# Patient Record
Sex: Male | Born: 1935 | Race: White | Hispanic: No | Marital: Married
Health system: Southern US, Community
[De-identification: ages and names within clinical notes are randomized; demographics above are authoritative.]

---

## 2005-01-26 ENCOUNTER — Ambulatory Visit: Payer: Self-pay | Admitting: Internal Medicine

## 2005-02-09 ENCOUNTER — Ambulatory Visit: Payer: Self-pay

## 2005-03-01 ENCOUNTER — Ambulatory Visit: Payer: Self-pay | Admitting: Internal Medicine

## 2006-03-20 ENCOUNTER — Other Ambulatory Visit: Payer: Self-pay

## 2006-03-20 ENCOUNTER — Inpatient Hospital Stay: Payer: Self-pay | Admitting: Internal Medicine

## 2006-03-21 ENCOUNTER — Other Ambulatory Visit: Payer: Self-pay

## 2006-11-15 ENCOUNTER — Ambulatory Visit: Payer: Self-pay | Admitting: Internal Medicine

## 2007-02-05 ENCOUNTER — Ambulatory Visit: Payer: Self-pay | Admitting: Internal Medicine

## 2007-05-01 ENCOUNTER — Ambulatory Visit: Payer: Self-pay | Admitting: Internal Medicine

## 2007-05-12 ENCOUNTER — Ambulatory Visit: Payer: Self-pay | Admitting: Internal Medicine

## 2007-07-28 ENCOUNTER — Other Ambulatory Visit: Payer: Self-pay

## 2007-07-28 ENCOUNTER — Inpatient Hospital Stay: Payer: Self-pay | Admitting: Cardiovascular Disease

## 2007-07-29 ENCOUNTER — Other Ambulatory Visit: Payer: Self-pay

## 2007-07-30 ENCOUNTER — Other Ambulatory Visit: Payer: Self-pay

## 2008-02-05 ENCOUNTER — Ambulatory Visit: Payer: Self-pay | Admitting: Internal Medicine

## 2010-09-12 ENCOUNTER — Ambulatory Visit: Payer: Self-pay

## 2011-04-03 ENCOUNTER — Inpatient Hospital Stay: Payer: Self-pay | Admitting: Internal Medicine

## 2011-04-03 LAB — COMPREHENSIVE METABOLIC PANEL
Albumin: 3.1 g/dL — ABNORMAL LOW (ref 3.4–5.0)
Alkaline Phosphatase: 41 U/L — ABNORMAL LOW (ref 50–136)
Anion Gap: 11 (ref 7–16)
Calcium, Total: 8.4 mg/dL — ABNORMAL LOW (ref 8.5–10.1)
Co2: 29 mmol/L (ref 21–32)
EGFR (African American): >60
EGFR (Non-African Amer.): >60
Glucose: 95 mg/dL (ref 65–99)
Osmolality: 279 (ref 275–301)
Potassium: 3.3 mmol/L — ABNORMAL LOW (ref 3.5–5.1)
SGOT(AST): 22 U/L (ref 15–37)
Sodium: 140 mmol/L (ref 136–145)

## 2011-04-03 LAB — CBC
HGB: 14.4 g/dL (ref 13.0–18.0)
MCH: 30.5 pg (ref 26.0–34.0)
MCHC: 32.4 g/dL (ref 32.0–36.0)
Platelet: 152 10*3/uL (ref 150–440)
RBC: 4.74 10*6/uL (ref 4.40–5.90)
WBC: 8.9 10*3/uL (ref 3.8–10.6)

## 2011-04-04 LAB — CBC WITH DIFFERENTIAL/PLATELET
Basophil #: 0 10*3/uL (ref 0.0–0.1)
Eosinophil #: 0 10*3/uL (ref 0.0–0.7)
Eosinophil %: 0 %
HCT: 45.2 % (ref 40.0–52.0)
Lymphocyte #: 0.7 10*3/uL — ABNORMAL LOW (ref 1.0–3.6)
MCH: 30.7 pg (ref 26.0–34.0)
MCV: 93 fL (ref 80–100)
Monocyte #: 0.1 10*3/uL (ref 0.0–0.7)
Monocyte %: 1.7 %
Neutrophil #: 7.6 10*3/uL — ABNORMAL HIGH (ref 1.4–6.5)
Platelet: 165 10*3/uL (ref 150–440)
RBC: 4.86 10*6/uL (ref 4.40–5.90)
WBC: 8.5 10*3/uL (ref 3.8–10.6)

## 2011-04-04 LAB — BASIC METABOLIC PANEL
Anion Gap: 12 (ref 7–16)
BUN: 18 mg/dL (ref 7–18)
Chloride: 103 mmol/L (ref 98–107)
Co2: 23 mmol/L (ref 21–32)
EGFR (Non-African Amer.): >60
Osmolality: 282 (ref 275–301)
Potassium: 4.1 mmol/L (ref 3.5–5.1)

## 2011-04-04 LAB — TROPONIN I: Troponin-I: <0.02 ng/mL

## 2011-04-04 LAB — CK TOTAL AND CKMB (NOT AT ARMC): CK-MB: 9.7 ng/mL — ABNORMAL HIGH (ref 0.5–3.6)

## 2011-04-07 LAB — URINALYSIS, COMPLETE
Bacteria: NONE SEEN
Bilirubin,UR: NEGATIVE
Blood: NEGATIVE
Glucose,UR: NEGATIVE mg/dL (ref 0–75)
Ketone: NEGATIVE
Nitrite: NEGATIVE
Protein: NEGATIVE
RBC,UR: NONE SEEN /HPF (ref 0–5)
Specific Gravity: 1.006 (ref 1.003–1.030)
WBC UR: NONE SEEN /HPF (ref 0–5)

## 2011-04-08 LAB — BASIC METABOLIC PANEL
Anion Gap: 5 — ABNORMAL LOW (ref 7–16)
BUN: 24 mg/dL — ABNORMAL HIGH (ref 7–18)
Chloride: 103 mmol/L (ref 98–107)
Co2: 31 mmol/L (ref 21–32)
EGFR (African American): >60
Glucose: 101 mg/dL — ABNORMAL HIGH (ref 65–99)
Osmolality: 282 (ref 275–301)

## 2011-04-08 LAB — CBC WITH DIFFERENTIAL/PLATELET
Basophil %: 0.1 %
Eosinophil %: 0 %
HCT: 47.2 % (ref 40.0–52.0)
HGB: 15.3 g/dL (ref 13.0–18.0)
Lymphocyte %: 7.6 %
MCHC: 32.3 g/dL (ref 32.0–36.0)
MCV: 95 fL (ref 80–100)
Monocyte %: 5 %
Neutrophil %: 87.3 %
Platelet: 185 10*3/uL (ref 150–440)
WBC: 13.1 10*3/uL — ABNORMAL HIGH (ref 3.8–10.6)

## 2011-04-09 LAB — CULTURE, BLOOD (SINGLE)

## 2011-04-23 ENCOUNTER — Ambulatory Visit: Payer: Self-pay

## 2011-04-23 LAB — CBC WITH DIFFERENTIAL/PLATELET
Basophil #: 0 10*3/uL (ref 0.0–0.1)
Basophil %: 0.4 %
HCT: 42.5 % (ref 40.0–52.0)
HGB: 14.2 g/dL (ref 13.0–18.0)
Lymphocyte #: 1.3 10*3/uL (ref 1.0–3.6)
MCH: 31.1 pg (ref 26.0–34.0)
MCHC: 33.4 g/dL (ref 32.0–36.0)
MCV: 93 fL (ref 80–100)
Monocyte #: 0.5 10*3/uL (ref 0.0–0.7)
Monocyte %: 6.8 %
Neutrophil #: 5.4 10*3/uL (ref 1.4–6.5)
Platelet: 135 10*3/uL — ABNORMAL LOW (ref 150–440)
RDW: 14.3 % (ref 11.5–14.5)
WBC: 7.2 10*3/uL (ref 3.8–10.6)

## 2011-04-23 LAB — COMPREHENSIVE METABOLIC PANEL
Albumin: 3 g/dL — ABNORMAL LOW (ref 3.4–5.0)
Alkaline Phosphatase: 58 U/L (ref 50–136)
BUN: 20 mg/dL — ABNORMAL HIGH (ref 7–18)
Chloride: 105 mmol/L (ref 98–107)
Co2: 27 mmol/L (ref 21–32)
EGFR (Non-African Amer.): >60
Potassium: 4.3 mmol/L (ref 3.5–5.1)
SGOT(AST): 23 U/L (ref 15–37)
Sodium: 141 mmol/L (ref 136–145)

## 2011-04-23 LAB — PRO B NATRIURETIC PEPTIDE: B-Type Natriuretic Peptide: 433 pg/mL (ref 0–450)

## 2011-06-25 ENCOUNTER — Ambulatory Visit: Payer: Self-pay

## 2011-08-03 ENCOUNTER — Inpatient Hospital Stay: Payer: Self-pay | Admitting: Internal Medicine

## 2011-08-03 LAB — COMPREHENSIVE METABOLIC PANEL
Bilirubin,Total: 0.3 mg/dL (ref 0.2–1.0)
Calcium, Total: 8.4 mg/dL — ABNORMAL LOW (ref 8.5–10.1)
Chloride: 102 mmol/L (ref 98–107)
Co2: 31 mmol/L (ref 21–32)
Creatinine: 1.12 mg/dL (ref 0.60–1.30)
EGFR (African American): >60
EGFR (Non-African Amer.): >60
Osmolality: 280 (ref 275–301)
Sodium: 140 mmol/L (ref 136–145)
Total Protein: 7.7 g/dL (ref 6.4–8.2)

## 2011-08-03 LAB — CBC
HGB: 14.8 g/dL (ref 13.0–18.0)
MCH: 31 pg (ref 26.0–34.0)
MCHC: 33.5 g/dL (ref 32.0–36.0)
RDW: 13.9 % (ref 11.5–14.5)

## 2011-08-03 LAB — PRO B NATRIURETIC PEPTIDE: B-Type Natriuretic Peptide: 169 pg/mL (ref 0–450)

## 2011-08-03 LAB — TROPONIN I: Troponin-I: 0.04 ng/mL

## 2011-08-03 LAB — MAGNESIUM: Magnesium: 2.1 mg/dL

## 2011-08-03 LAB — CK TOTAL AND CKMB (NOT AT ARMC)
CK, Total: 261 U/L — ABNORMAL HIGH (ref 35–232)
CK-MB: 6.4 ng/mL — ABNORMAL HIGH (ref 0.5–3.6)

## 2011-08-04 LAB — BASIC METABOLIC PANEL
Anion Gap: 13 (ref 7–16)
BUN: 17 mg/dL (ref 7–18)
Co2: 25 mmol/L (ref 21–32)
Creatinine: 1.46 mg/dL — ABNORMAL HIGH (ref 0.60–1.30)
EGFR (African American): 52 — ABNORMAL LOW
Sodium: 142 mmol/L (ref 136–145)

## 2011-08-05 LAB — CBC WITH DIFFERENTIAL/PLATELET
Basophil %: 0.1 %
Eosinophil #: 0 10*3/uL (ref 0.0–0.7)
HCT: 44.1 % (ref 40.0–52.0)
HGB: 14.1 g/dL (ref 13.0–18.0)
Lymphocyte %: 5.5 %
MCH: 29.9 pg (ref 26.0–34.0)
MCHC: 32 g/dL (ref 32.0–36.0)
Monocyte #: 0.4 x10 3/mm (ref 0.2–1.0)
Neutrophil %: 91.5 %
Platelet: 170 10*3/uL (ref 150–440)
RDW: 14.3 % (ref 11.5–14.5)
WBC: 12.9 10*3/uL — ABNORMAL HIGH (ref 3.8–10.6)

## 2011-08-05 LAB — BASIC METABOLIC PANEL
Anion Gap: 8 (ref 7–16)
BUN: 24 mg/dL — ABNORMAL HIGH (ref 7–18)
Chloride: 107 mmol/L (ref 98–107)
Glucose: 138 mg/dL — ABNORMAL HIGH (ref 65–99)
Osmolality: 293 (ref 275–301)
Potassium: 4.3 mmol/L (ref 3.5–5.1)
Sodium: 144 mmol/L (ref 136–145)

## 2011-08-08 LAB — CULTURE, BLOOD (SINGLE)

## 2011-08-23 ENCOUNTER — Ambulatory Visit: Payer: Self-pay | Admitting: Internal Medicine

## 2011-09-01 LAB — COMPREHENSIVE METABOLIC PANEL
Albumin: 2.8 g/dL — ABNORMAL LOW (ref 3.4–5.0)
Alkaline Phosphatase: 57 U/L (ref 50–136)
Anion Gap: 7 (ref 7–16)
BUN: 14 mg/dL (ref 7–18)
Chloride: 105 mmol/L (ref 98–107)
EGFR (African American): >60
Glucose: 83 mg/dL (ref 65–99)
Osmolality: 285 (ref 275–301)
SGOT(AST): 21 U/L (ref 15–37)
SGPT (ALT): 17 U/L (ref 12–78)
Total Protein: 7 g/dL (ref 6.4–8.2)

## 2011-09-01 LAB — CBC
HGB: 13.5 g/dL (ref 13.0–18.0)
MCHC: 32.6 g/dL (ref 32.0–36.0)
MCV: 93 fL (ref 80–100)
Platelet: 220 10*3/uL (ref 150–440)
RBC: 4.47 10*6/uL (ref 4.40–5.90)

## 2011-09-01 LAB — APTT: Activated PTT: 28.4 secs (ref 23.6–35.9)

## 2011-09-01 LAB — CK TOTAL AND CKMB (NOT AT ARMC)
CK, Total: 85 U/L (ref 35–232)
CK-MB: 2.2 ng/mL (ref 0.5–3.6)

## 2011-09-01 LAB — PROTIME-INR
INR: 0.9
Prothrombin Time: 12.7 secs (ref 11.5–14.7)

## 2011-09-02 ENCOUNTER — Inpatient Hospital Stay: Payer: Self-pay | Admitting: Internal Medicine

## 2011-09-02 LAB — CK TOTAL AND CKMB (NOT AT ARMC)
CK, Total: 139 U/L (ref 35–232)
CK, Total: 356 U/L — ABNORMAL HIGH (ref 35–232)
CK-MB: 3.5 ng/mL (ref 0.5–3.6)
CK-MB: 6.7 ng/mL — ABNORMAL HIGH (ref 0.5–3.6)

## 2011-09-02 LAB — APTT
Activated PTT: 28 secs (ref 23.6–35.9)
Activated PTT: 33 secs (ref 23.6–35.9)

## 2011-09-02 LAB — CBC WITH DIFFERENTIAL/PLATELET
Basophil #: 0 10*3/uL (ref 0.0–0.1)
Eosinophil %: 4.8 %
Lymphocyte #: 1.3 10*3/uL (ref 1.0–3.6)
Lymphocyte %: 30.6 %
MCH: 30.4 pg (ref 26.0–34.0)
MCV: 91 fL (ref 80–100)
Monocyte %: 14.6 %
Neutrophil %: 49.5 %
Platelet: 222 10*3/uL (ref 150–440)
RBC: 4.36 10*6/uL — ABNORMAL LOW (ref 4.40–5.90)
RDW: 13.6 % (ref 11.5–14.5)
WBC: 4.3 10*3/uL (ref 3.8–10.6)

## 2011-09-03 LAB — APTT: Activated PTT: 83.7 secs — ABNORMAL HIGH (ref 23.6–35.9)

## 2011-09-03 LAB — LIPID PANEL
Cholesterol: 239 mg/dL — ABNORMAL HIGH (ref 0–200)
Ldl Cholesterol, Calc: 178 mg/dL — ABNORMAL HIGH (ref 0–100)
VLDL Cholesterol, Calc: 23 mg/dL (ref 5–40)

## 2011-09-04 LAB — PLATELET COUNT: Platelet: 210 10*3/uL (ref 150–440)

## 2011-09-04 LAB — APTT: Activated PTT: 157.9 secs — ABNORMAL HIGH (ref 23.6–35.9)

## 2011-09-04 LAB — HEMOGLOBIN: HGB: 13 g/dL (ref 13.0–18.0)

## 2011-09-05 LAB — APTT
Activated PTT: 47.1 secs — ABNORMAL HIGH (ref 23.6–35.9)
Activated PTT: 77.3 secs — ABNORMAL HIGH (ref 23.6–35.9)

## 2011-09-06 LAB — HEMOGLOBIN: HGB: 13.7 g/dL (ref 13.0–18.0)

## 2011-09-07 LAB — APTT: Activated PTT: 99.9 secs — ABNORMAL HIGH (ref 23.6–35.9)

## 2011-09-11 DIAGNOSIS — I2510 Atherosclerotic heart disease of native coronary artery without angina pectoris: Secondary | ICD-10-CM

## 2011-09-11 DIAGNOSIS — I742 Embolism and thrombosis of arteries of the upper extremities: Secondary | ICD-10-CM

## 2011-09-11 DIAGNOSIS — F068 Other specified mental disorders due to known physiological condition: Secondary | ICD-10-CM

## 2011-09-11 DIAGNOSIS — J449 Chronic obstructive pulmonary disease, unspecified: Secondary | ICD-10-CM

## 2011-09-23 ENCOUNTER — Ambulatory Visit: Payer: Self-pay | Admitting: Internal Medicine

## 2011-11-23 DEATH — deceased

## 2012-03-12 ENCOUNTER — Ambulatory Visit: Payer: Self-pay | Admitting: Internal Medicine

## 2014-02-21 IMAGING — CR DG CHEST 2V
1 series · 2 of 2 positions shown · non-contrast
Comparison: none

REASON FOR EXAM: Shortness of Breath
COMMENTS:   May transport without cardiac monitor

PROCEDURE:     DXR - DXR CHEST PA (OR AP) AND LATERAL  - August 03, 2011  [DATE]
RESULT:     Two-view chest dated 08/03/2011 comparison made to prior study
dated 06/25/2011.

[Series 1: w chest pa · 0.14mm/px · 2 of 2 slices shown]
[im 1/2]
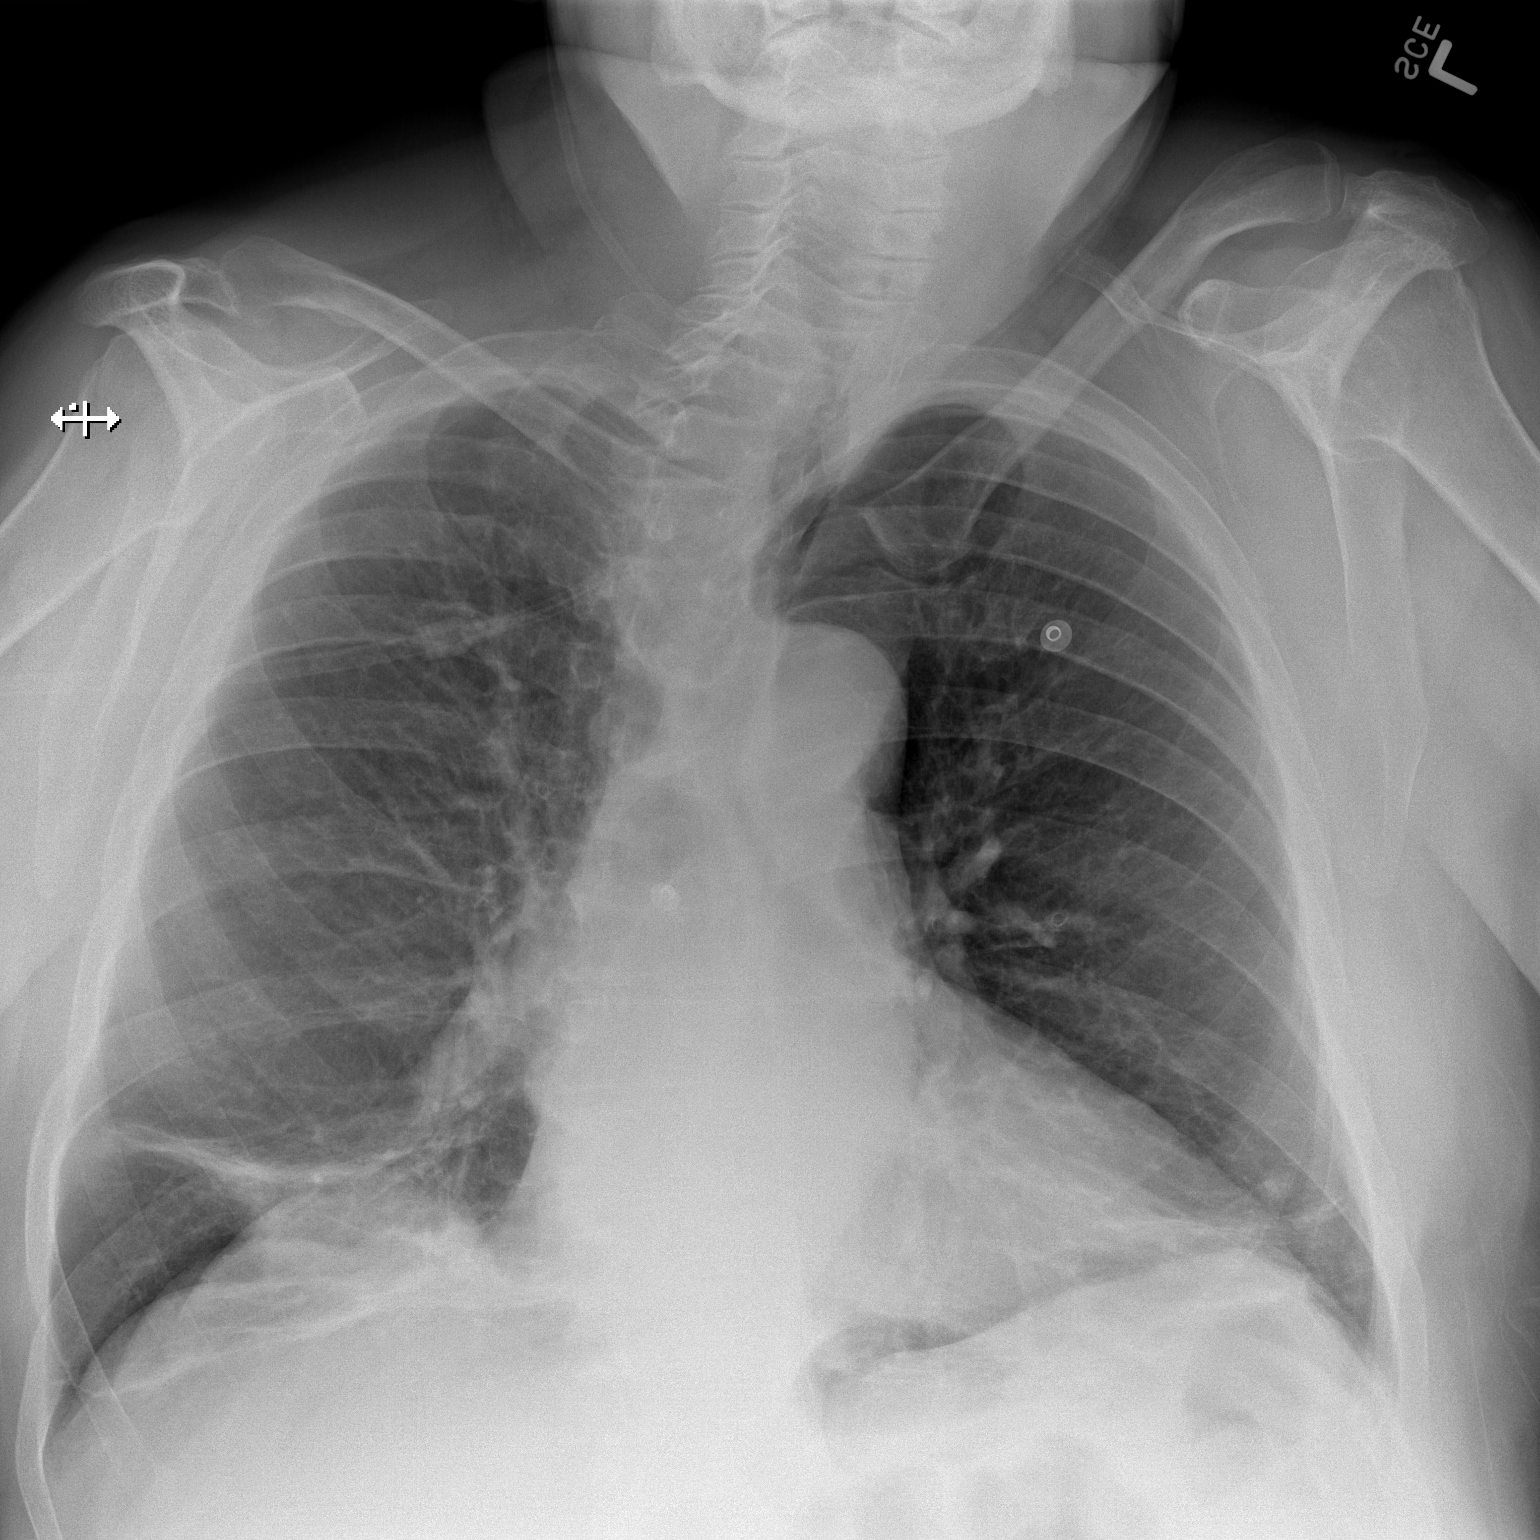
[im 2/2]
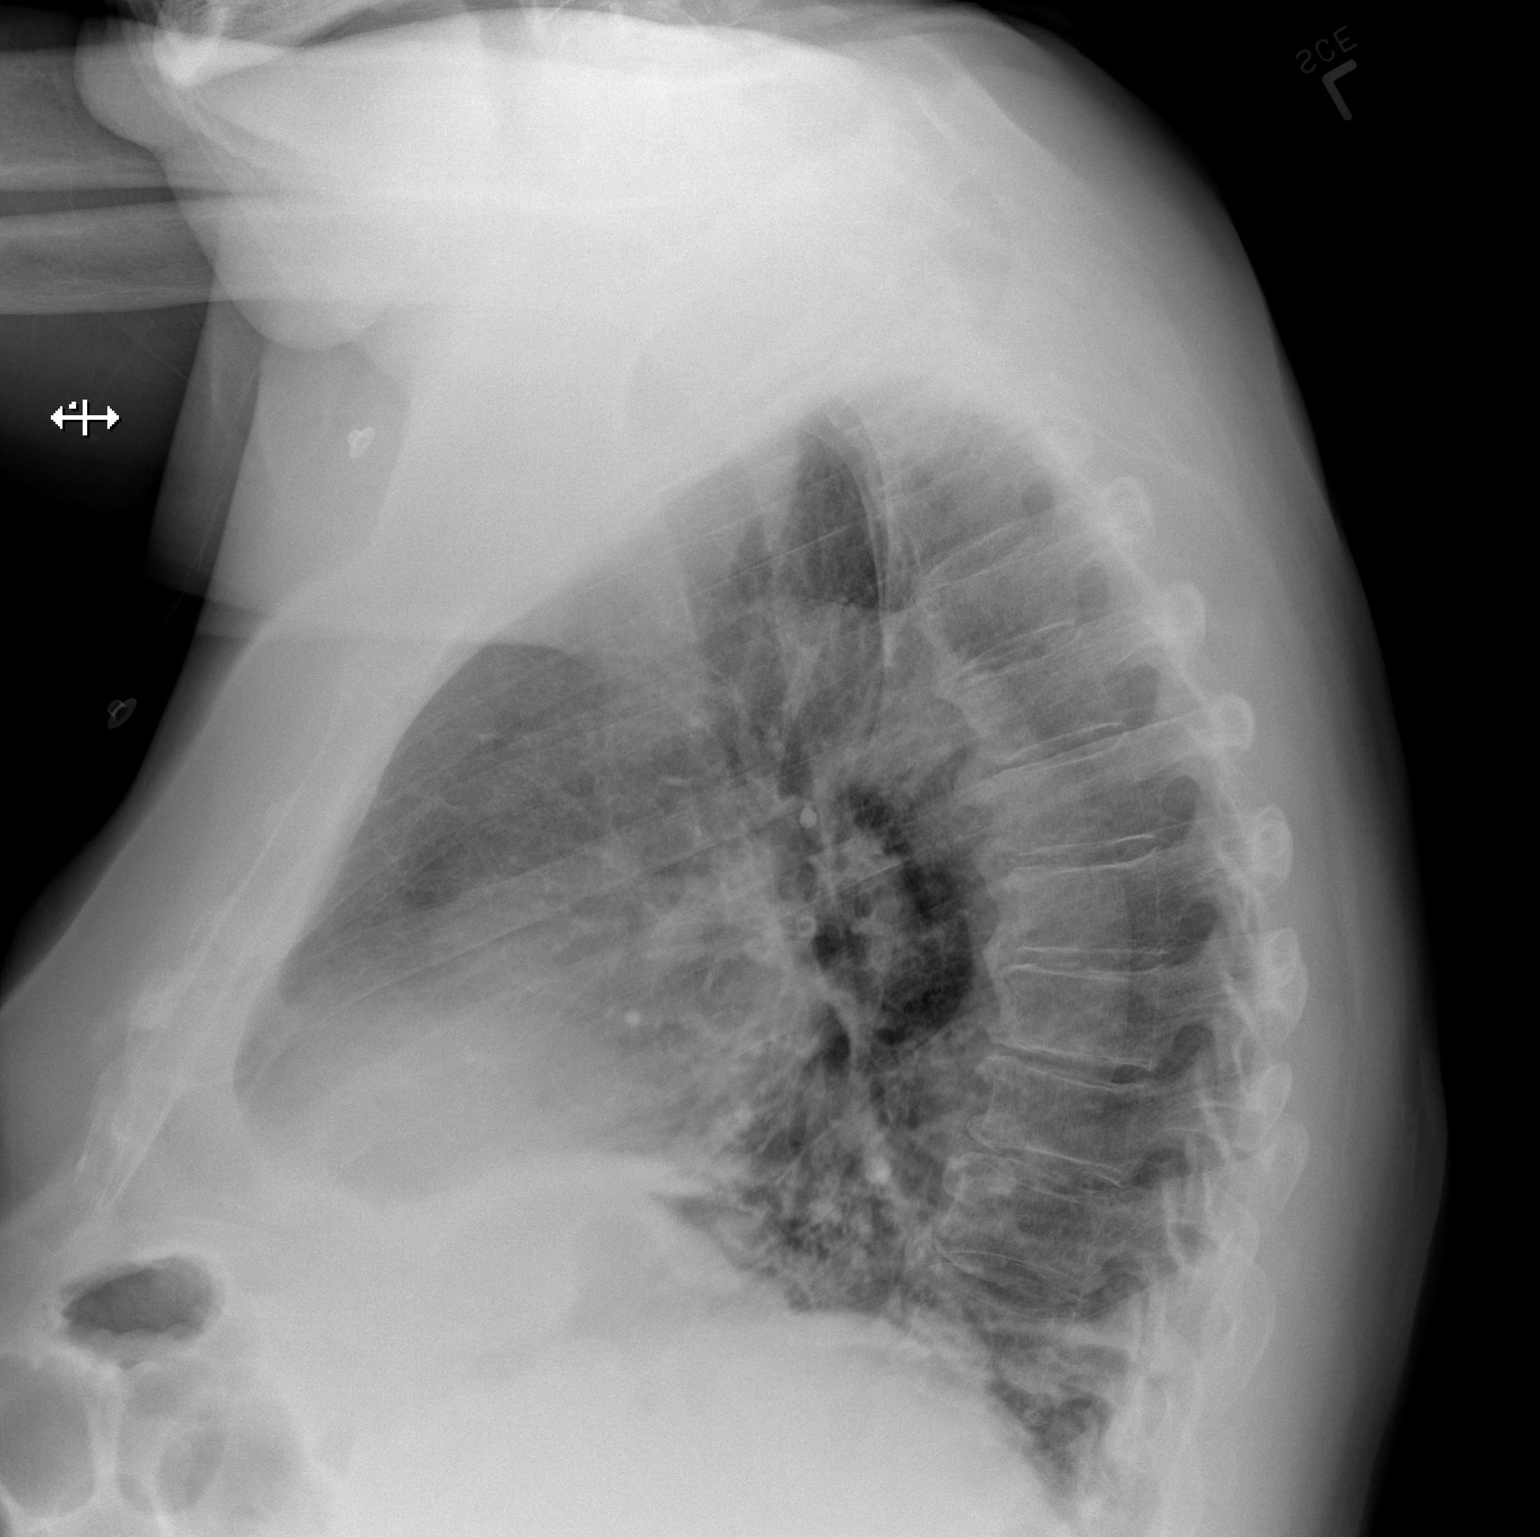

[2 of 2 positions shown; findings below may reference images not displayed]

FINDINGS: Linear areas of increased density project within the right and
left lung bases right greater than left. These findings likely represent the
sequela of discoid atelectasis no chronic scarring as of also diagnostic
consideration. Is otherwise no focal region of consolidation or focal
infiltrates. There is mild prominence of interstitial markings. The cardiac
silhouette is enlarged. Aorta is tortuous and ectatic. The visualized bony
skeleton is grossly unremarkable.
IMPRESSION: Likely areas of discoid atelectasis versus chronic scarring
within the lung bases. A linear infectious inflammatory infiltrate cannot be
excluded though is of lower differential consideration.
2. Findings which may reflect a component of mild pulmonary edema.

## 2014-05-11 NOTE — Discharge Summary (Signed)
PATIENT NAME:  Clayton Norton, Clayton Norton MR#:  [id number] DATE OF BIRTH:  March 13, 1929  DATE OF ADMISSION:  09/02/2011 DATE OF DISCHARGE:  09/07/2011  DIAGNOSES:  1. Acute left arm ischemia due to brachial artery thrombus status post thrombectomy 09/06/2011. 2. Acute on chronic obstructive pulmonary disease exacerbation.  3. Coronary artery disease, status post stent in the RCA 2009. 4. Gastroesophageal reflux disease. 5. Hypertension.  6. Hyperlipidemia.  7. Sleep apnea.  8. Transient ischemic attack.   DISPOSITION: The patient is being discharged to rehab facility. Use CPAP at night.   DIET: Low sodium, low cholesterol, low fat.   ACTIVITY: As tolerated.  Continue rehab at the facility.  FOLLOWUP: Follow up with Dr. Wyn Quaker, Dr. Freda Munro, Dr. Beverely Risen, and Dr. Darrold Junker 1 to 2 weeks after discharge.   DISCHARGE MEDICATIONS:  1. Citalopram 10 mg daily.  2. Advair 500/50, 1 puff b.i.d.  3. Omeprazole 20 mg daily.  4. Lasix 80 mg daily.  5. Albuterol/ipratropium 2 puffs q.i.d. for shortness of breath.  6. Spiriva 18 mcg inhaled daily.  7. Plavix 75 mg daily.  8. DuoNebs q. 6 hours while awake. 9. Prednisone taper as prescribed.  10. Tylenol 325 mg, 2 tablets every four hours p.r.n.  11. Aspirin 325 mg daily.  12. Tramadol 50 mg every six hours p.r.n.  13. Simvastatin 80 mg daily.  14. Risperdal 0.25 mg q. 8 hours as needed for agitation.  15. Xanax 0.25 mg q. 8 hours as needed for anxiety.  16. Amlodipine 10 mg daily.  17. Dulcolax 10 mg suppository once a day as needed for constipation.  18. Colace 100 mg b.i.d.   CONSULTATIONS: 1. Vascular surgical consultations with Dr. Gilda Crease, Dr. Wyn Quaker. 2. Pulmonary consultation with Dr. Freda Munro. 3. Cardiology consultation with Dr. Darrold Junker. 4. Palliative care consultation with Dr. Harvie Junior.   LABORATORY, DIAGNOSTIC, AND RADIOLOGICAL DATA: Chest x-ray: Bibasilar atelectasis.  Carotid Doppler: Left internal carotid artery stenosis 50 to 75%.   Doppler bilateral lower extremities: Occlusive thrombus in the right popliteal vein. No evidence of thrombosis elsewhere in the right lower extremity or left lower extremity.  Echo: Left ventricular systolic function is normal, ejection fraction more than 55%, mild concentric left ventricular hypertrophy, technically difficult. White count normal, platelet count normal, hemoglobin normal. VLDL 23, LDL 178, cholesterol 239, HDL 38, triglycerides 117. Cardiac enzymes 0.3 to 0.23. The rest of the complete metabolic panel normal.   HOSPITAL COURSE: The patient is an 81 year old male with past medical history of multiple problems who presented with acute pain and coldness in his left upper extremity.  1. Acute left arm ischemia: His Doppler showed right popliteal vein  thrombosis. He had an angio done on 08/14  which showed a large thrombus in the left brachial artery. He underwent thrombectomy on 09/06/2011. Throughout the hospitalization he was on a heparin drip. Following thrombectomy the patient had good circulation in his arm. He did not have any residual motor deficits. The patient was stable from a vascular surgery point of view. He should have been on Coumadin. However, he is elderly with possible underlying dementia and multiple medical problems and a high fall risk. Therefore, he is not a good candidate for long-term anticoagulation with Coumadin. He will be treated with full dose aspirin and Plavix. 2. Acute on chronic obstructive pulmonary disease exacerbation. The patient has oxygen-dependent chronic obstructive pulmonary disease and sleep apnea and has not been using his CPAP. During the hospitalization he developed CO2 narcosis, which improved on  BiPAP. He was treated with steroids, which are being tapered, and nebulizer treatments. He was using CPAP during the hospitalization and should continue to do so as an outpatient. He was seen by his pulmonologist Dr. Welton FlakesKhan who had no other recommendations.   3. Coronary artery disease, status post proximal stent in the RCA. The patient had no chest pain. He had very minimally elevated troponins likely due to demand ischemia. He was seen by his cardiologist Dr. Darrold JunkerParaschos and no intervention was planned.  His ejection fraction is more than 55%.  4. Accelerated hypertension. The patient had elevated blood pressure which was treated with Norvasc and p.r.n. hydralazine. 5. Multiple medical problems. The patient's overall prognosis is guarded. Palliative care consultation was obtained.  His wife is thinking about having Hospice down the lane.  Currently the patient is a DO NOT RESUSCITATE.   TIME SPENT: 45 minutes.    ____________________________ Darrick MeigsSangeeta Zimir Kittleson, MD sp:bjt D: 09/07/2011 10:58:40 ET T: 09/07/2011 11:34:29 ET JOB#: [id number]  cc: Darrick MeigsSangeeta Kaeo Jacome, MD, <Dictator> Lyndon CodeFozia M. Khan, MD Darrick MeigsSANGEETA Franko Hilliker MD ELECTRONICALLY SIGNED 09/07/2011 15:00

## 2014-05-11 NOTE — Consult Note (Signed)
General Aspect no pulse in the left arm    Present Illness The patient is an 81 y/o male with a  hx CAD/stent and O2 dependent COPD here with left arm pain associated with numbess/weakness and elevated troponin.  The patient is a poor historian, HPI is from wife she says the pain began abruptly at 9 pm yesterday.  It woke him from sleep and subsequently he could not move his left arm per wife no other neuro symptoms.  She does note that he has severe carotid blockages but they were told he can't have surgery because his lungs were too bad.  PAST MEDICAL HISTORY:  1. Chronic obstructive pulmonary disease, oxygen dependent.  2. Hypertension.  3. Hyperlipidemia.  4. History of coronary artery disease, status post stent placement.  5. Gastroesophageal reflux disease. 6. Depression.  7.           Sleep apnea with noncompliance with CPAP   Home Medications: Medication Instructions Status  albuterol aerosol with adapter 90 mcg/inh 2 puff(s)  every 4 hours, As Needed Active  tiotropium 18 mcg inhalation capsule 1 cap(s) inhaled once a day Active  Plavix 75 mg oral tablet 1  orally once a day  Active  citalopram 10 mg oral tablet 1 tab(s) orally once a day Active  Advair Diskus 500 mcg-50 mcg inhalation powder 1 puff(s) inhaled 2 times a day Active  omeprazole 20 mg oral delayed release capsule 1 cap(s) orally once a day Active  Valium 5 mg oral tablet 1 tab(s) orally 2 times a day, As Needed Active  Lasix 80 mg oral tablet 1 tab(s) orally once a day Active  albuterol-ipratropium 103 mcg-18 mcg/inh inhalation aerosol 2 puff(s) inhaled 4 times a day, for Shortness of Breath Active    NKDA: None  Case History:   Family History Non-Contributory    Social History negative tobacco, negative ETOH, negative Illicit drugs   Review of Systems:   ROS Pt not able to provide ROS   Physical Exam:   GEN well developed, well nourished, no acute distress, sleeping upon my entering the room     HEENT pale conjunctivae, PERRL, hearing intact to voice, poor dentition    NECK supple  trachea midline    RESP postive use of accessory muscles  wheezing    CARD regular rate  LE edema present  no JVD    ABD denies tenderness  soft  obese nondistended    EXTR positive cyanosis/clubbing, negative edema, right arm 2+ radial and brachial pulses;  Left arm cool sluggish cap refill, 1-2+ axillary pulse nonpalpable brachial, radial or ulnar pulses    SKIN No ulcers, skin turgor poor    NEURO follows commands, motor/sensory function intact    PSYCH poor insight, depressed   Nursing/Ancillary Notes: **Vital Signs.:   11-Aug-13 03:55   Vital Signs Type Admission   Temperature Temperature (F) 96.8   Celsius 36   Temperature Source AdultAxillary   Pulse Pulse 70   Respirations Respirations 24   Systolic BP Systolic BP 759   Diastolic BP (mmHg) Diastolic BP (mmHg) 76   Mean BP 90   Pulse Ox % Pulse Ox % 92   Pulse Ox Activity Level  At rest   Oxygen Delivery 4L     Hepatic:  10-Aug-13 22:45    Bilirubin, Total 0.2   Alkaline Phosphatase 57   SGPT (ALT) 17   SGOT (AST) 21   Total Protein, Serum 7.0   Albumin, Serum  2.8  Routine Chem:  10-Aug-13 22:45    Result Comment TROPONIN - RESULTS VERIFIED BY REPEAT TESTING.  - READ-BACK PROCESS PERFORMED.  - C/ANN CALES 09/01/11 2359 SJL  Result(s) reported on 02 Sep 2011 at 12:01AM.   Glucose, Serum 83   BUN 14   Creatinine (comp) 1.25   Sodium, Serum 143   Potassium, Serum 3.6   Chloride, Serum 105   CO2, Serum 31   Calcium (Total), Serum  8.4   Osmolality (calc) 285   eGFR (African American) >60   eGFR (Non-African American)  54 (eGFR values <92m/min/1.73 m2 may be an indication of chronic kidney disease (CKD). Calculated eGFR is useful in patients with stable renal function. The eGFR calculation will not be reliable in acutely ill patients when serum creatinine is changing rapidly. It is not useful in  patients on  dialysis. The eGFR calculation may not be applicable to patients at the low and high extremes of body sizes, pregnant women, and vegetarians.)   Anion Gap 7  Cardiac:  10-Aug-13 22:45    CK, Total 85   CPK-MB, Serum 2.2 (Result(s) reported on 01 Sep 2011 at 11:56PM.)   Troponin I  0.30 (0.00-0.05 0.05 ng/mL or less: NEGATIVE  Repeat testing in 3-6 hrs  if clinically indicated. >0.05 ng/mL: POTENTIAL  MYOCARDIAL INJURY. Repeat  testing in 3-6 hrs if  clinically indicated. NOTE: An increase or decrease  of 30% or more on serial  testing suggests a  clinically important change)  Routine Coag:  10-Aug-13 22:45    Prothrombin 12.7   INR 0.9 (INR reference interval applies to patients on anticoagulant therapy. A single INR therapeutic range for coumarins is not optimal for all indications; however, the suggested range for most indications is 2.0 - 3.0. Exceptions to the INR Reference Range may include: Prosthetic heart valves, acute myocardial infarction, prevention of myocardial infarction, and combinations of aspirin and anticoagulant. The need for a higher or lower target INR must be assessed individually. Reference: The Pharmacology and Management of the Vitamin K  antagonists: the seventh ACCP Conference on Antithrombotic and Thrombolytic Therapy. CQIWLN.9892Sept:126 (3suppl): [id number] A HCT value >55% may artifactually increase the PT.  In one study,  the increase was an average of 25%. Reference:  "Effect on Routine and Special Coagulation Testing Values of Citrate Anticoagulant Adjustment in Patients with High HCT Values." American Journal of Clinical Pathology 2006;126:400-405.)   Activated PTT (APTT) 28.4 (A HCT value >55% may artifactually increase the APTT. In one study, the increase was an average of 19%. Reference: "Effect on Routine and Special Coagulation Testing Values of Citrate Anticoagulant Adjustment in Patients with High HCT Values." American Journal of  Clinical Pathology 2006;126:400-405.)  Routine Hem:  10-Aug-13 22:45    WBC (CBC) 5.6   RBC (CBC) 4.47   Hemoglobin (CBC) 13.5   Hematocrit (CBC) 41.3   Platelet Count (CBC) 220 (Result(s) reported on 01 Sep 2011 at 11:30PM.)   MCV 93   MCH 30.2   MCHC 32.6   RDW 13.9     Impression 1. Acute Ischemia of the left upper extremity, on going for at least 10 hours; patient still has motor and sensory function with narcotics his pain is well controlled.  He has evidence for an evolving MI and in conjunction with  his O2 dependent COPD is at prohibitive risk for surgery.  I have discussed several ooptions with the patient and his wife.  It is my recommendation that we continue the  heparin gtt and antiplatelet therapy and follow his troponin.  He does not have a history of Afib so this may not be a simple embolism.  The only operation that he may tolerate under the current circumstances is a simple brachial embolectomy under local.  given the uncertainty of the etiology of the ischemia if the embolectomy does not work the chances of loss of limb would be extremely high.  At this point he has a stable arm that is neurologically intact. If he rules out for an MI then I would plan left arm angiogram and further treatment based on that information. 2. Chronic obstructive pulmonary disease exacerbation, continue aggressive treatment with IV steroids, nebulizer treatments around-the-clock, continue his Advair, add some Spiriva. Follow his sputum cultures and follow him clinically. Continue oxygen supplementation.  3. Acute myocardial infarction with elevated Troponin and history of coronary artery disease, status post stent placement. The patient currently has no chest pain. I will continue his ASA and heparin, statin, and beta blocker for now.  4. Gastroesophageal reflux disease. Continue with Prilosec.  5. Hyperlipidemia. Continue simvastatin.  6. Depression. Continue with Celexa. 7.           DVT right  popliteal.  on heparin  no filter at this time.    Plan level 5 consult   Electronic Signatures: Hortencia Pilar (MD)  (Signed 11-Aug-13 07:48)  Authored: General Aspect/Present Illness, Home Medications, Allergies, History and Physical Exam, Vital Signs, Labs, Impression/Plan   Last Updated: 11-Aug-13 07:48 by Hortencia Pilar (MD)

## 2014-05-11 NOTE — Op Note (Signed)
PATIENT NAME:  Clayton LewandowskyASTWOOD, Zae D MR#:  [id number] DATE OF BIRTH:  [birth year]  DATE OF PROCEDURE:  09/05/2011  PREOPERATIVE DIAGNOSIS: Ischemia, left arm.   POSTOPERATIVE DIAGNOSES:  1. Ischemia, left arm.  2. Embolization, left arm.   PROCEDURES PERFORMED:  1. Arch aortogram.  2. Left upper extremity angiography, third order catheter placement.   PROCEDURE PERFORMED BY: Renford DillsGregory G. Zharia Conrow, MD   SEDATION: The patient is receiving parenteral narcotics. Versed is given intravenously, a total of 1 mg, representing conscious sedation. Continuous ECG, pulse oximetry, and cardiopulmonary monitoring is performed throughout the entire procedure by the interventional radiology staff. Total sedation time was 45 minutes.   ACCESS: 5 French sheath, right common femoral artery.   CONTRAST USED: Isovue 50 mL.   FLUORO TIME: 4.2 minutes.   INDICATIONS: Mr. Clayton Norton is an 81 year old gentleman with multiple complicated medical problems who is COPD oxygen dependent, severe cardiac disease, and critical bilateral carotid stenoses. He is status post several strokes. He presented with pain, numbness, and coldness of his left arm. Physical exam suggests an acute change and the patient is undergoing angiography to more appropriately plan his therapy. Risks and benefits were reviewed. All questions are answered. The patient agrees to proceed.   PROCEDURE: The patient is taken to Special Procedures and placed in the supine position. After adequate sedation is achieved, his right groin is prepped and draped in a sterile fashion. Ultrasound is placed in a sterile sleeve. Ultrasound is utilized secondary to lack of appropriate landmarks and to avoid vascular injury. Common femoral artery is identified. It is echolucent and pulsatile indicating patency. Under direct ultrasound visualization after recording an image for the permanent record, micropuncture needle is inserted, microwire followed micro sheath, J-wire followed  by 5 French sheath and 5 French pigtail catheter. Pigtail catheter is then advanced with the J wire up into the ascending aorta and LAO projection of the arch is obtained. Using a stiff angled Glidewire and H1 catheter, the left subclavian is engaged. Hand injection of contrast is utilized to demonstrate the more distal subclavian axillary. The wire is then reintroduced and the catheter is advanced out into the proximal brachial. Distal runoff is then obtained. After review of the images, the catheter is removed over a wire, oblique view of the right groin is obtained, and a StarClose device deployed. There are no immediate complications.   INTERPRETATION: The arch is normal configuration, type I arch. No ostial stenosis of any of the great vessels is noted. No significant ulcerative lesions are identified.   The left subclavian and axillary arteries are widely patent just beyond the origin of the brachial artery as there is a large thrombus burden. There is flow around this thrombus and distal runoff is achieved. There is one area within the thrombus that may represent just narrowing or actual accumulation of thrombus. It is difficult to say. Brachial artery distally is widely patent. There is two-vessel runoff to the hand via the radial and the interosseous. Ulnar artery is absent. There is a small AV fistula between the brachial artery and the cephalic vein noted.   SUMMARY: Embolization to the proximal brachial artery as described.   ____________________________ Renford DillsGregory G. Cherith Tewell, MD ggs:drc D: 09/05/2011 09:42:59 ET T: 09/05/2011 11:39:32 ET JOB#: [id number]  cc: Renford DillsGregory G. Janiyla Long, MD, <Dictator> Renford DillsGREGORY G Gennaro Lizotte MD ELECTRONICALLY SIGNED 09/18/2011 10:49

## 2014-05-11 NOTE — H&P (Signed)
PATIENT NAME:  Clayton Norton, Clayton Norton MR#:  [id number] DATE OF BIRTH:  01-09-1930  DATE OF ADMISSION:  09/02/2011  PRIMARY CARE PHYSICIAN: Dr. Beverely RisenFozia Khan. PRIMARY CARDIOLOGIST: Dr. Gwen PoundsKowalski.   CHIEF COMPLAINT: Left arm pain and numbness.   HISTORY OF PRESENT ILLNESS: This is an 81 year old male with a history of chronic obstructive pulmonary disease, transient ischemic attack, coronary artery disease status post stents, obstructive sleep apnea noncompliant with CPAP machine, who presents with the above complaint. The patient is a poor historian. History of present illness is actually taken from the wife who is at bedside. According to the wife, over the past two days the patient has had generalized weakness. No other symptoms. However, today about 9:00 p.m. the patient woke up, was on his way to the restroom. He realized that his left arm was very weak and very numb and painful. He was unable to move his left arm. He went to his wife to discuss this. The wife then called the home health care nurse who recommended that the patient come to the ER for further evaluation. The patient's wife does state that the patient could not move the left arm at all. The patient is now able to move the left arm. He has no symptoms of numbness or weakness. He also denies any chest pain, shortness of breath, or wheezing. He has chronic lower extremity edema. She says it is worst since after his last hospitalization where they told him to elevate his legs, but he does not. He also has obstructive sleep apnea, but is noncompliant with his CPAP machine. In the Emergency Room, lab work was checked. His troponin was 0.30 with normal CK, CPK-MB.   REVIEW OF SYSTEMS:  CONSTITUTIONAL: No fever. Positive fatigue and weakness. EYES: No blurred or double vision, glaucoma or cataracts. ENT: Positive hearing loss. Positive seasonal allergies. Positive snoring. RESPIRATORY: No cough. Positive wheezing at times. Positive chronic obstructive  pulmonary disease. Positive shortness of breath, which is chronic. CARDIOVASCULAR: No chest pain, orthopnea, edema. Positive dyspnea on exertion. No palpitations or syncope. GASTROINTESTINAL: No nausea, vomiting, diarrhea, abdominal pain, melena, or ulcers. GU: No dysuria or hematuria. ENDOCRINE: No polyuria or polydipsia. HEME/LYMPH: Positive easy bruising. SKIN: No rash or lesions. MUSCULOSKELETAL: Positive generalized weakness and limited activity due to body habitus. NEUROLOGIC: Positive history of transient ischemic attack. No cerebrovascular accident, vertigo tremor, or dysarthria. PSYCH: Positive anxiety.   PAST MEDICAL HISTORY:  1. Coronary artery disease status post stents.  2. Chronic obstructive pulmonary disease.  3. Anxiety.  4. Transient ischemic attack.  5. Hypertension.   PAST SURGICAL HISTORY:    1. Hernia repair.  2. Appendectomy.   ALLERGIES: No known drug allergies.   SOCIAL HISTORY: The patient quit smoking approximately six months ago. He was smoking for about 40 to 50 years. No alcohol or IV drug use.   FAMILY HISTORY: Mother died of old age at 8290. Father had heart disease.   PHYSICAL EXAMINATION:  VITAL SIGNS: Temperature 97.5, pulse 69, respirations 18, blood pressure 138/92, 94% on room air.   GENERAL: The patient is alert. He is oriented x3, does not appear to be in acute distress.   HEENT: Head is atraumatic. Pupils are round and reactive. They are about 3 mm bilaterally. Oropharynx is clear.   NECK: Very short. Hard to appreciate any carotid bruit or enlarged thyroid.   CARDIOVASCULAR: Regular rate and rhythm. No murmurs, gallops, or rubs. PMI is hard to palpate due to body habitus.   LUNGS:  Clear to auscultation. There are no rales, rhonchi, or wheezing. He does have some upper airway wheezing.   ABDOMEN: Bowel sounds are positive. Nontender, nondistended, obese. Hard to appreciate organomegaly due to body habitus.   EXTREMITIES: 2+ pitting edema  bilaterally.   NEUROLOGIC: Cranial nerves II through XII are intact. There are no focal deficits.   STRENGTH: 4/5 strength bilaterally and symmetrically. Sensation is intact bilaterally and symmetrically. Cerebellar exam is negative.  SKIN: No rash or lesions.   LABORATORY, RADIOLOGICAL AND DIAGNOSTIC DATA: Troponin is 0.30. CK 85. CPK-MB 2.2. INR 0.9. White blood cells 5.6, hemoglobin 13.5, hematocrit 41.3, platelets 220. Sodium 143, potassium 3.6, chloride 105, bicarbonate 31, BUN 14, creatinine 1.25 glucose 83, calcium 8.4, bilirubin 0.2, alkaline phosphatase 57, ALT 17, AST 21, total protein 7.0, albumin 2.8. EKG shows sinus rhythm with PACs, PVCs. Chest x-ray shows no acute cardiopulmonary disease.   ASSESSMENT AND PLAN: An 81 year old male with a history of coronary artery disease status post stent, chronic obstructive pulmonary disease, obstructive sleep apnea noncompliant with CPAP, transient ischemic attack, who presents with left arm numbness and weakness.  1. Left arm numbness and weakness with pain. The patient is a poor historian but according to the patient's wife, the patient could not move his left arm. He also has an elevated troponin. It is unclear if this is related to a neurologic event versus a musculoskeletal issue versus a cardiac issue. The patient will be admitted to telemetry. Continue to cycle cardiac enzymes. Neuro checks every four hours, order an MRI, carotid Doppler's, and echocardiogram. We will continue Plavix and statin. Will also continue to cycle his cardiac enzymes and if needed if the cardiac enzymes continue to increase, will need a cardiology consult. I have empirically placed him on full dose Lovenox for now.  2. Elevated troponin. Could be related to acute coronary syndrome and his left arm numbness could be a manifestation of anginal equivalent. The patient will be admitted to telemetry. Continue cardiac enzymes. Continue close monitoring. Cardiology consult if  troponin is still elevated. Will continue Plavix and statin.  3. Chronic obstructive pulmonary disease, which is stable. Will continue inhalers.  4. Hypertension. Continue Norvasc.  5. Anxiety. Continue Celexa.  6. Obstructive sleep apnea. The patient has lower extremity edema likely as a result of his obstructive sleep apnea noncompliant with CPAP. The patient does not want a CPAP machine, however, due to his lower extremity edema, will order Doppler's to rule out deep venous thrombosis.  7. The patient is a DO NOT RESUSCITATE.   TIME SPENT: 45 minutes.    ____________________________ Janyth Contes. Juliene Pina, MD spm:ap Norton: 09/02/2011 01:03:16 ET T: 09/02/2011 11:00:03 ET JOB#: [id number]  cc: Santasia Rew P. Juliene Pina, MD, <Dictator> Lyndon Code, MD Janyth Contes Yariah Selvey MD ELECTRONICALLY SIGNED 09/02/2011 13:10

## 2014-05-11 NOTE — Discharge Summary (Signed)
PATIENT NAME:  Clayton Norton, Clayton Norton MR#:  [id number] DATE OF BIRTH:  03/09/1929  DATE OF ADMISSION:  08/03/2011 DATE OF DISCHARGE:  08/08/2011  PRESENTING COMPLAINT: Shortness of breath.   DISCHARGE DIAGNOSES:  1. Acute on chronic respiratory failure due to chronic obstructive pulmonary disease flare.  2. Bronchitis.  3. Morbid obesity.  <<MISSING TEXT>>   ____________________________ Wylie HailSona A. Allena KatzPatel, MD sap:bjt Norton: 08/09/2011 07:06:48 ET T: 08/09/2011 13:11:48 ET JOB#: [id number]  cc: Lunabelle Oatley A. Allena KatzPatel, MD, <Dictator>

## 2014-05-11 NOTE — Consult Note (Signed)
Chief Complaint:   Subjective/Chief Complaint had angiogram done yesterday and today is going for thrombectomy.   VITAL SIGNS/ANCILLARY NOTES: **Vital Signs.:   15-Aug-13 14:05   Vital Signs Type Routine   Temperature Temperature (F) 97.9   Celsius 36.6   Temperature Source Oral   Pulse Pulse 91   Respirations Respirations 22   Systolic BP Systolic BP 146   Diastolic BP (mmHg) Diastolic BP (mmHg) 80   Mean BP 102   Pulse Ox % Pulse Ox % 91   Pulse Ox Activity Level  At rest   Oxygen Delivery 6L  *Intake and Output.:   Shift 15-Aug-13 15:00   Grand Totals Intake:  483.6 Output:  2050    Net:  -1566.4 24 Hr.:  -1566.4   Heparin      In:  483.6   Urine Post Catheter Insertion      Out:  2050   Length of Stay Totals Intake:  3481.3 Output:  6500    Net:  -3018.7   Brief Assessment:   Cardiac Regular  + LE edema  --Gallop    Respiratory normal resp effort  no use of accessory muscles  rhonchi    Gastrointestinal details normal Soft  Bowel sounds normal  No rigidity   Lab Results: Routine Coag:  15-Aug-13 04:06    Activated PTT (APTT)  102.2 (A HCT value >55% may artifactually increase the APTT. In one study, the increase was an average of 19%. Reference: "Effect on Routine and Special Coagulation Testing Values of Citrate Anticoagulant Adjustment in Patients with High HCT Values." American Journal of Clinical Pathology 2006;126:400-405.)  Routine Hem:  15-Aug-13 05:30    Platelet Count (CBC) 207 (Result(s) reported on 06 Sep 2011 at 05:59AM.)   Hemoglobin (CBC) 13.7 (Result(s) reported on 06 Sep 2011 at 05:59AM.)   Assessment/Plan:  Assessment/Plan:   Assessment 1. Acute on Chronic Respiratory failure -secondary to COPD on inhalers at this time -continue with oxygen therapy -BIPAP needed and is on proper pressures 2. Brachial Thrombosis -for removal today -surgical risk is high will need close monitoring post-op   Electronic Signatures: Yevonne PaxKhan, Cordelle Dahmen A (MD)   (Signed 15-Aug-13 14:20)  Authored: Chief Complaint, VITAL SIGNS/ANCILLARY NOTES, Brief Assessment, Lab Results, Assessment/Plan   Last Updated: 15-Aug-13 14:20 by Yevonne PaxKhan, Melodi Happel A (MD)

## 2014-05-11 NOTE — Op Note (Signed)
PATIENT NAME:  [name], Tan D MR#:  [id number] DATE OF BIRTH:  03-Oct-1929  DATE OF PROCEDURE:  09/06/2011  PREOPERATIVE DIAGNOSES:  1. Left upper extremity embolus with ischemia.  2. Severe chronic obstructive pulmonary disease.  3. Morbid obesity.  4. Hypertension.   POSTOPERATIVE DIAGNOSES:  1. Left upper extremity embolus with ischemia.  2. Severe chronic obstructive pulmonary disease.  3. Morbid obesity.  4. Hypertension.   PROCEDURE PERFORMED:   1. Left brachial embolectomy.  2. Left upper extremity angiogram.   SURGEON: Annice NeedyJason S. Dew, MD  ANESTHESIA: MAC.   ESTIMATED BLOOD LOSS: 50 mL.   INDICATION FOR PROCEDURE: The patient is an 81 year old white male with left upper extremity embolus and ischemia. He had an arteriogram performed the day prior and showed the embolus in the axillary and brachial arteries. He is brought down for embolectomy to improve his circulation. The risks and benefits were discussed. Informed consent was obtained.   DESCRIPTION OF PROCEDURE: The patient was brought to the operative suite. He was given an intravenous sedative. His left upper extremity was sterilely prepped and draped, and a sterile surgical field was created. A transverse incision was created overlying the brachial artery. The brachial artery was dissected out and encircled with vessel loops proximally and distally. A transverse arteriotomy was made, and a 4 Fogarty embolectomy balloon was then passed proximally, and a large volume of thrombus was removed with brisk inflow. A second pass revealed no additional thrombus. On preoperative imaging, there was question of a lesion in the axillary brachial artery. I put a Kumpe catheter up to the shoulder, and performed imaging down to more distal brachial artery, and did not see any high-grade stenosis at this point, so no further intervention was needed. At this point, the arteriotomy was closed with interrupted Prolene sutures. Surgicel was placed.  The wound was irrigated and closed with 3-0 Vicryl and 4-0 Monocryl. The patient was taken to the recovery room in stable condition having tolerated the procedure well.   ____________________________ Annice NeedyJason S. Dew, MD jsd:cbb D: 09/07/2011 08:43:02 ET T: 09/07/2011 12:19:15 ET JOB#: [id number]  cc: Annice NeedyJason S. Dew, MD, <Dictator> Annice NeedyJASON S DEW MD ELECTRONICALLY SIGNED 09/11/2011 8:29

## 2014-05-11 NOTE — Discharge Summary (Signed)
PATIENT NAME:  Clayton Norton, Clayton Norton MR#:  [id number] DATE OF BIRTH:  [birth year]  DATE OF ADMISSION:  08/03/2011 DATE OF DISCHARGE:  08/08/2011  PRESENTING COMPLAINT: Shortness of breath.   DISCHARGE DIAGNOSES:  1. Acute on chronic respiratory failure secondary to chronic obstructive pulmonary disease flare. Bronchitis.  2. Obesity.  3. Chronic home oxygen use.  4. Hypertension.  5. Untreated sleep apnea secondary to patient noncompliance to constant positive airway pressure.     CONDITION ON DISCHARGE: Fair.   CODE STATUS: FULL CODE.   DISCHARGE MEDICATIONS:  1. Plavix 75 mg daily.  2. Citalopram 10 mg daily.  3. Advair 500/50 one puff b.i.Norton.   4. Omeprazole 20 mg daily.  5. Valium 5 mg twice a day as needed.  6. Simvastatin 80 mg daily.  7. Lasix 80 mg daily.  8. DuoNebs.  9. Ipratropium albuterol 2 puffs inhaled 4 times a day for shortness of breath.  10. Prednisone taper as instructed.  11. Amlodipine 10 mg daily.  12. Tiotropium 18 mcg inhalation 1 capsule daily.  13. Polyethylene glycol 17 grams orally once daily as needed for constipation. 14. Levaquin 250 mg p.o. daily as instructed.   REFERRAL: Home health services with physical therapy.   OXYGEN: 2 to 3 liters continuous.   FOLLOWUP: Follow up with Dr. Freda MunroSaadat Khan in 1 to 2 weeks.   LABORATORY DATA: CBC within normal limits except white count of 12.9. Glucose 138, BUN 24, creatinine 1.26, sodium 144, potassium 4.3, chloride 107, blood cultures negative in five days. Magnesium is 2.1. Chest x-ray: No evidence of pneumonia. Mild pulmonary edema. Troponin 0.04.   CONSULTATIONS: None other than physical therapy.   BRIEF SUMMARY OF HOSPITAL COURSE: Clayton Norton is an 81 year old morbidly obese Caucasian gentleman with 02 dependent chronic obstructive pulmonary disease, hypertension, depression, gastroesophageal reflux disease and  history of coronary artery disease with stent placement, comes in with worsening shortness of  breath and hypoxia. He is admitted with:  1. Acute on chronic obstructive pulmonary disease exacerbation. The patient also had symptoms suggestive of mild acute bronchitis. He was started on high dose of Solu-Medrol, antibiotics, inhalers and nebulizers. The patient did show some slow improvement. His untreated sleep apnea also is playing a major role. The patient was recommended to get another sleep study done as outpatient and try a different device for his sleep apnea which could be the nasal prongs instead of a mask which makes the patient claustrophobic. The patient will finish up the prednisone taper along with Levaquin at home.  2. Acute on chronic respiratory failure secondary to #1.  3. Coronary artery disease, status post stent. No chest pain. Continue Plavix and statin. Beta blockers were held given bronchospasms and the patient was placed on Norvasc for his hypertension.  4. Hypertension. Beta blockers were discontinued given the patient's severe chronic obstructive pulmonary disease and Norvasc was prescribed.  5. Gastroesophageal reflux disease. Continue Prilosec.  6. Hyperlipidemia. On simvastatin.  7. Depression. Continue Celexa.   Hospital stay otherwise remained stable. The patient remained a FULL CODE.       TIME SPENT: 40 minutes.   ____________________________ Wylie HailSona A. Allena KatzPatel, MD sap:ap Norton: 08/09/2011 07:19:30 ET T: 08/09/2011 13:49:28 ET JOB#: [id number]  cc: Kerryn Tennant A. Allena KatzPatel, MD, <Dictator> Yevonne PaxSaadat A. Khan, MD Lyndon CodeFozia M. Khan, MD Willow OraSONA A Ikram Riebe MD ELECTRONICALLY SIGNED 08/20/2011 13:14

## 2014-05-11 NOTE — Consult Note (Signed)
PATIENT NAME:  Clayton Norton MR#:  [id number] DATE OF BIRTH:  [birth year]  DATE OF CONSULTATION:  09/02/2011  REFERRING PHYSICIAN:  Adrian SaranSital Mody, MD  CONSULTING PHYSICIAN:  Marcina MillardAlexander Armend Hochstatter, MD  PRIMARY CARE PHYSICIAN: Beverely RisenFozia Khan, MD   CHIEF COMPLAINT: Left arm pain.   REASON FOR CONSULTATION: Consultation requested for evaluation of elevated troponin.   HISTORY OF PRESENT ILLNESS: The patient is an 81 year old gentleman with known coronary artery disease status post prior stent, COPD, history of TIAs with obstructive sleep apnea on the CPAP machine. The [facility] Emergency Room for generalized weakness and left arm pain. The patient has been evaluated by Dr. Gilda CreaseSchnier and is noted to have left arm ischemia with lack of pulse and cool left extremity. The patient has borderline elevated troponin in the absence of any recent chest pain. EKG is nondiagnostic showing right bundle branch block without acute ischemic ST-T wave changes.   PAST MEDICAL HISTORY:  1. Status post prior MI and coronary stent in 2007.  2. COPD.  3. Hypertension.  4. History of transient ischemic attacks.  5. Anxiety.    MEDICATIONS:  1. Plavix 75 mg daily.  2. Lasix 80 mg daily.  3. Albuterol 2 puffs q.4.  4. Tiotropium 18 mcg inhalation capsule 1 daily.  5. Citalopram 10 mg daily.  6. Advair Diskus 500/50 one puff b.i.Norton.  7. Omeprazole 20 mg daily.  8. Valium 5 mg b.i.Norton. p.r.n.  9. Albuterol/ipratropium 2 puffs q.i.Norton.   SOCIAL HISTORY: The patient is married, lives with his wife. He has at least a 50-pack-year tobacco abuse history, quit six months ago.   FAMILY HISTORY: Father with history of heart disease. No known specifics.   REVIEW OF SYSTEMS ACCORDING TO WIFE: CONSTITUTIONAL: No fever or chills. EYES: No hearing loss. EARS: The patient does have decreased hearing. RESPIRATORY: The patient had recent hospitalization for COPD with chronic exertional dyspnea. CARDIOVASCULAR: No chest  pain. GI: No nausea, vomiting, or diarrhea. GU: No dysuria or hematuria. ENDOCRINE: No polyuria or polydipsia. MUSCULOSKELETAL: No arthralgias or myalgias. NEUROLOGICAL: The patient has had prior history of transient ischemic attacks. PSYCHIATRIC: The patient does have anxiety.   PHYSICAL EXAMINATION:   VITAL SIGNS: Blood pressure 132/76, pulse 84, respirations 20, temperature 98, pulse oximetry 85%.   HEENT: Pupils equal and reactive to light and accommodation.   NECK: Supple without thyromegaly.   LUNGS: Clear.   HEART: Normal JVP. Normal PMI. Regular rate and rhythm. Normal S1, S2. No appreciable gallop, murmur, rub.   ABDOMEN: Soft, nontender.   EXTREMITIES: Left arm was very cool to touch with absent radial pulse.   MUSCULOSKELETAL: Normal muscle tone.   NEUROLOGIC: The patient was basically unarousable. With sternal rub the patient would open his eyes and would not converse or answer any questions.   IMPRESSION: This is an 81 year old gentleman who presents with left arm ischemia. The patient currently is unarousable which is worrisome for possible CVA in light of prior history of transient ischemic attacks. The patient has borderline elevated troponin in the absence of chest pain or ECG changes. This easily could be due to demand/supply ischemia especially in light of left arm ischemia and possible CVA.   RECOMMENDATIONS:  1. Agree with overall current therapy.  2. Agree with heparin drip per nomogram  3. Review 2-Norton echocardiogram.  4. Would consider neurological evaluation, MRI pending.  5. Further recommendations pending echocardiogram results.   ____________________________ Marcina MillardAlexander Lashann Hagg, MD ap:drc Norton: 09/02/2011 12:19:36 ET T:  09/03/2011 09:47:08 ET JOB#: [id number]  cc: Marcina Millard, MD, <Dictator> Marcina Millard MD ELECTRONICALLY SIGNED 09/26/2011 8:59

## 2014-05-11 NOTE — Consult Note (Signed)
PATIENT NAME:  Clayton Norton, Clayton Norton MR#:  [id number] DATE OF BIRTH:  04/20/1929  DATE OF CONSULTATION:  09/04/2011  REFERRING PHYSICIAN:   CONSULTING PHYSICIAN:  Yevonne PaxSaadat A. Khan, MD  REASON FOR CONSULTATION: Acute on chronic respiratory failure.  HISTORY OF PRESENT ILLNESS: The patient is an 81 year old gentleman who has a history of morbid obesity, obstructive sleep apnea, COPD, ongoing history of tobacco use, and noncompliant with CPAP. He also has a history of cardiac decompensation. He came into the hospital on the 11th with numbness and pain in the left arm. He was seen in the Emergency Room, was evaluated, and he was not able to move the left arm at all. Concern was raised for whether this may be a cardiac cause or a neurological cause. Initial evaluation showed troponin of 0.3 and normal CPK. Cardiology consultation was called. Dr. Darrold JunkerParaschos initially saw the patient and agreed with heparin drip at the time and to get a neurological evaluation and also to get an echocardiogram. The patient subsequently has had admission ABG which was 7.23/83/68 and follow-up ABG was 7.36/61/63 which is more likely around his baseline. The echocardiogram showed normal LV function, left ventricular hypertrophy, and diastolic dysfunction. The patient had a chest x-ray done which showed bilateral areas of atelectasis versus fibrosis. He also had an ultrasound done of his lower extremities suggestive of an occlusive thrombus in the right popliteal vein. He had carotid studies done which showed hemodynamic compromise about 50 to 75%.   PAST MEDICAL HISTORY: 1. Coronary artery disease. 2. Chronic obstructive pulmonary disease. 3. History of transient ischemic attack. 4. Anxiety. 5. Hypertension. 6. Obstructive sleep apnea.  PAST SURGICAL HISTORY: 1. Appendectomy. 2. Hernia repair.   ALLERGIES: Negative.  SOCIAL HISTORY: Positive for tobacco use which has been on and off. He hasn't smoked maybe for about six months.  No alcohol or drug use.  FAMILY HISTORY: Positive for coronary disease.  REVIEW OF SYSTEMS: Review of systems was attempted but the patient was not really able to cooperate.  PHYSICAL EXAMINATION:  VITAL SIGNS: Temperature 97, pulse 83, respiratory rate 18, blood pressure 145/87, sats were 93%.   NECK: Neck appeared to be supple. There was no JVD. No adenopathy. No thyromegaly.  CHEST: Chest showed overall diminished breath sounds. No rales.  CARDIOVASCULAR: S1, S2 normal. Regular rhythm. No gallop or rub.  ABDOMEN: Obese, soft. No hepatosplenomegaly.   EXTREMITIES: Some edema. Pulses were equal. No cyanosis noted.   NEUROLOGIC: He was somnolent at the time that he was seen so full neuro examination could not be done.  SKIN: Without any acute rash.  MUSCULOSKELETAL: Without any active synovitis.   LABORATORY DATA: Coags show a PTT of 157. Today his hematology showed a hemoglobin of 13, platelet count of 210. Chemistries were done and showed a CPK bump of 356 with MB of 6.7. Echo as already noted above.   IMPRESSION: 1. Acute on chronic respiratory failure. 2. Obstructive sleep apnea.  3. Morbid obesity. 4. Noncompliance.   PLAN: It has been an ongoing struggle to have Clayton Norton be compliant with recommended treatment as an outpatient. He has left upper extremity ischemia and he is scheduled to have an angiogram done for that purpose. The patient's current medications are reviewed. As far as his respiratory status is concerned, he is on Advair which should be continued. He is also on Spiriva which will be continued and is getting steroids which will be continued. Will await the results of the angiogram. Try to encourage  compliance with BiPAP which is in the room but at the time that I saw him it was not on him. I will make further recommendations as deemed necessary.   OVERALL PROGNOSIS: Poor.  Agree with DO NOT RESUSCITATE status.  ____________________________ Yevonne Pax, MD sak:drc Norton: 09/04/2011 11:42:46 ET T: 09/04/2011 12:57:21 ET JOB#: [id number]  cc: Yevonne Pax, MD, <Dictator> Yevonne Pax MD ELECTRONICALLY SIGNED 09/12/2011 11:46

## 2014-05-11 NOTE — Consult Note (Signed)
Brief Consult Note: Diagnosis: Borderline elevated trop without CP or ECG changes, in setting of left arm ischemia and possible CVA, probable demand/supply  ischemia.   Patient was seen by consultant.   Consult note dictated.   Comments: REC  Agree with current therapy, cont hep drip, review echo, await brain MRI, consider Neuro eval.  Electronic Signatures: Marcina MillardParaschos, Jynesis Nakamura (MD)  (Signed 11-Aug-13 12:23)  Authored: Brief Consult Note   Last Updated: 11-Aug-13 12:23 by Marcina MillardParaschos, Jessabelle Markiewicz (MD)

## 2014-05-16 NOTE — H&P (Signed)
PATIENT NAME:  Clayton Norton, Clayton Norton MR#:  [id number] DATE OF BIRTH:  [birth year]  DATE OF ADMISSION:  08/03/2011  PRIMARY CARE PHYSICIAN: Dr. Beverely Risen. PULMONOLOGIST: Dr. Freda Munro.   CHIEF COMPLAINT: Shortness of breath.   HISTORY OF PRESENT ILLNESS: This is an 81 year old male who presents from his primary care physician's office due to worsening exertional shortness of breath and hypoxia. The patient has underlying baseline chronic obstructive pulmonary disease, is oxygen dependent, but as per the wife, patient's shortness of breath has been progressively getting worse over the past 2 weeks. The patient was in the hospital March of this past year for some chronic obstructive pulmonary disease exacerbation and underlying pneumonia. As per the wife, since he has left the hospital he has not been doing very well. His shortness of breath has progressively gotten worse. He has worsening shortness of breath even on minimal exertion when trying to get to the bathroom. He went to Dr. Santo Held office today for a followup for an echocardiogram. After the echocardiogram patient developed worsening hypoxia and wheezing with sats in the low 80s. He was sent over to the ER for further evaluation. In the emergency room, patient appears to be bronchospastic and is wheezing diffusely. Hospitalist services were contacted for further treatment and evaluation. As per the wife, patient does have a cough which is productive but it is clear sputum but no fevers, no chest pains, no nausea, no vomiting, no diarrhea, no other associated symptoms, no sick contacts.   REVIEW OF SYSTEMS: CONSTITUTIONAL: No documented fever. No weight gain, no weight loss. EYES: No blurred or double vision. ENT: No tinnitus or postnasal drip. No redness of the oropharynx. RESPIRATORY: Positive cough. Positive wheeze. Positive chronic obstructive pulmonary disease. No hemoptysis. CARDIOVASCULAR: No chest pain, no orthopnea, no palpitations, no syncope.  GI: No nausea, no vomiting, no diarrhea, no abdominal pain, no melena or hematochezia. GU: No dysuria or hematuria. ENDOCRINE: No polyuria or nocturia. No heat or cold intolerance.  HEME: No anemia. No bruising. No bleeding. INTEGUMENTARY: No rashes. No lesions. MUSCULOSKELETAL: No arthritis, no swelling, no gout. NEUROLOGIC: No numbness, no tingling, no ataxia, no seizure-type activity. PSYCH: No anxiety, no insomnia, no ADD.   PAST MEDICAL HISTORY:  1. Chronic obstructive pulmonary disease, oxygen dependent.  2. Hypertension.  3. Hyperlipidemia.  4. History of coronary artery disease, status post stent placement.  5. Gastroesophageal reflux disease. 6. Depression.   ALLERGIES: No known drug allergies.   SOCIAL HISTORY: He used to be a long-time smoker about 40 to 50 pack-years, quit this past March. No alcohol abuse. No illicit drug abuse. Lives at home with his wife.   FAMILY HISTORY: The patient's mother died from old age at 86. Father had heart disease. There is a strong history of heart disease in his father's side of the family.   CURRENT MEDICATIONS:  1. Advair 550, 1 puff b.i.d.  2. Albuterol inhaler as needed.  3. Albuterol nebulizer q.4 to 6 hours as needed.  4. Celexa 10 mg daily.  5. Drisdol 50,000 weekly.  6. Lasix 40 mg daily.  7. Metoprolol tartrate 25 mg b.i.d.  8. MiraLAX daily.  9. Omeprazole 20 mg daily.  10. Plavix 75 mg daily.  11. Simvastatin 80 mg daily.  12. Valium 5 mg b.i.d. as needed.   PHYSICAL EXAMINATION ON ADMISSION:  VITAL SIGNS: Temperature 97.9, pulse 82, respirations 18, blood pressure 119/81.   GENERAL: He is a pleasant-appearing male in mild respiratory distress.   HEENT:  Atraumatic, normocephalic. Extraocular muscles are intact. Pupils are equal and reactive to light. Sclerae anicteric. No conjunctival injection. No pharyngeal erythema.   NECK: Supple. There is no jugular venous distention. No bruits. No lymphadenopathy. No thyromegaly.    HEART: Regular rate and rhythm. No murmurs, no rubs, no clicks.   LUNGS: He has diffuse rhonchi and wheezing bilaterally. Negative use of accessory muscles. No dullness to percussion.   ABDOMEN: Soft, flat, nontender, nondistended. Has good bowel sounds. No hepatosplenomegaly appreciated.   EXTREMITIES: No evidence of any cyanosis, clubbing. He does have +1 to 2 pitting edema from the knees to the ankles bilaterally.   SKIN: Moist and warm with no rashes.   LYMPHATIC: There is no cervical or axillary lymphadenopathy.   LABORATORY EXAM: Serum glucose of 91, BUN 15, creatinine 1.1, sodium 140, potassium 3.3, chloride 102, bicarbonate 31. LFTs are within normal limits. Troponin 0.04. White cell count 7.5, hemoglobin 14.8, hematocrit 44.3, platelet count of 163,000.   The patient did have a chest x-ray done which shows discoid atelectasis versus scarring in the lung bases, mild pulmonary edema.   ASSESSMENT AND PLAN: This is an 81 year old male with history of chronic obstructive pulmonary disease, oxygen dependent, hypertension, depression, GERD, history of coronary artery disease, status post stent placement, hyperlipidemia, presents to the hospital with worsening shortness of breath and hypoxia.  1. Acute on chronic respiratory failure. This is likely secondary to chronic obstructive pulmonary disease exacerbation. We will continue aggressive treatment with IV steroids, nebulizer treatments around-the-clock, continue his Advair, add some Spiriva, give empiric Levaquin. Follow his sputum cultures and follow him clinically. Continue oxygen supplementation.  2. Chronic obstructive pulmonary disease exacerbation, possibly secondary to underlying bronchitis. Patient's chest x-ray is not suggestive of pneumonia. For now I will treat him aggressively with IV steroids, around-the-clock nebulizer treatments, continuation of his Advair.  I will add some Spiriva, give him empiric Levaquin. Follow his  sputum cultures. Follow him clinically. He is already on oxygen at home. I will continue that supplementation for now.  3. History of coronary artery disease, status post stent placement. The patient currently has no chest pain. EKG shows no acute ST or T wave changes. I will continue his Plavix, statin, and beta blocker for now.  4. Gastroesophageal reflux disease. Continue with Prilosec.  5. Hyperlipidemia. Continue simvastatin.  6. Depression. Continue with Celexa.  7. Hypokalemia. This could be diuretic induced as the patient is on Lasix. I will go ahead and replace his potassium and repeat it in the morning. We will also go ahead and check his magnesium level.   CODE STATUS: PATIENT IS A FULL CODE.   TIME SPENT WITH THE ADMISSION: 50 minutes.     ____________________________ Rolly PancakeVivek J. Cherlynn KaiserSainani, MD vjs:vtd D: 08/03/2011 20:06:46 ET T: 08/04/2011 07:46:28 ET JOB#: [id number]  cc: Rolly PancakeVivek J. Cherlynn KaiserSainani, MD, <Dictator> Lyndon CodeFozia M. Khan, MD Houston SirenVIVEK J Olamae Ferrara MD ELECTRONICALLY SIGNED 08/04/2011 9:17

## 2014-05-16 NOTE — Discharge Summary (Signed)
PATIENT NAME:  Clayton Norton, Clayton Norton MR#:  [id number] DATE OF BIRTH:  November 24, 1929  DATE OF ADMISSION:  04/03/2011 DATE OF DISCHARGE:  04/08/2011  REFERRING PHYSICIAN: Dr. Dana Allan PRIMARY CARE PHYSICIAN:  Dr. Beverely Risen  CARDIOLOGIST:  Dr. Arnoldo Hooker PULMONOLOGIST: Dr. Freda Munro ADMITTING PHYSICIAN:  Dr. Larena Glassman DISCHARGING PHYSICIAN:  Dr. Larena Glassman  ADMITTING DIAGNOSIS:  Acute on chronic respiratory failure.   DISCHARGE DIAGNOSES:  1. Acute or chronic respiratory failure secondary to possible pneumonia.  2. Chronic obstructive pulmonary disease exacerbation.  3. Continued smoking/tobacco abuse.  4. Coronary artery disease.  5. Hypertension.  6. Shingles, zoster, ophthalmic involvement.  7. History of congestive heart failure.  8. Hypokalemia.  9. Debilitation.  10. Obesity.   CONSULTANTS:  1. Case management.  2. Dr. Freda Munro. 3. Physical therapy. 4. Occupational therapy.   TESTS DONE DURING THIS HOSPITALIZATION:  1. Chest x-ray 04/03/2011 showed increased density at the base. This may be secondary to fibrosis. Similar although less prominent changes were present on prior exam. Pneumonia or atelectasis cannot be excluded.  2. Chest x-ray on 04/07/2011 showed no acute disease of the chest.   HOSPITAL COURSE: Initial History and Physical were done by me. Please refer to my note dated 04/03/2011 for complete details.  In brief, this is an 81 year old white male with past medical history of coronary artery disease status post stents, actively smoking, oxygen dependent on 2 liters who presents with worsening shortness of breath, green phlegm, and low-grade temperature. The patient was thought to have pneumonia and was admitted to the hospitalist service.  1. Acute on chronic respiratory failure: This was most likely secondary to pneumonia versus chronic obstructive pulmonary disease exacerbation versus bronchitis.  He was continued on azithromycin, ceftriaxone,  nebulizers, steroids in the form of Solu-Medrol and weaned to oral prednisone.  Dr. Freda Munro agreed. He was on 4 liters of oxygen and was weaned down to about 3 to 2 liters of oxygen.  In addition, he was given mucolytics. We titrated up his Advair and added Spiriva as well to his regimen. We discharged him on nebulizers as well.  2. Coronary artery disease, hypertension: We continued metoprolol, Plavix, and statin.  3. Smoking: Counseled about cessation on multiple occasions.  4. Shingles zoster, ophthalmic involvement: Continue prednisolone. The patient is to see Dr. Dellie Burns as outpatient.  5. History of congestive heart failure: Continued on Lasix. The patient was not in heart failure.  6. Hypokalemia: We repleted.  7. Deep vein thrombosis prophylaxis:  Prophylaxis maintained on Plavix and Lovenox.  8. Debilitation: The patient was recommended for discharge with Home Health by PT. 9. Patient had some mild leukocytosis with steroid use. Most likely this was steroid effect.   He was discharged on 04/08/2011. Temperature 98, heart rate 81, respiratory rate 18, blood pressure 146/93, sating 96% on 3 liters.  Lungs were clear to auscultation. Cardiovascular: Regular rate and rhythm. Abdomen benign.   LABORATORY DATA: Glucose 101, BUN 24, creatinine 0.89, sodium 139, potassium 5.1, chloride 103, bicarbonate 31, anion gap 5. White count 13.1, hemoglobin 15.3, hematocrit 47.2, platelets 185, MCV 95.    DISCHARGE MEDICATIONS:  1. Plavix 75 mg daily.  2. Omeprazole 20 mg daily.  3. Albuterol 90 mcg 2 puffs every four hours as needed.  4. Metoprolol 25 mg three times daily. 5. Lasix 40 mg daily.  6. Prednisolone acetate ophthalmic solution, one drop to affected eye 4 times a day.  7. Citalopram 10 mg daily. Pravastatin 20  mg daily.  8. Mucinex 600 mg p.o. twice a day #14.  9. Spiriva 18 mcg inhaled daily.  10. Nicotine patch 21 mg transdermally daily, seven days' worth. 11. Advair Diskus to  500/50, 1 puff b.i.d.  12. Azithromycin 250 mg p.o. daily times five days.  13. Albuterol ipratropium 3 mL  SVN every four hours p.r.n. extreme shortness of breath, 14-day supply and nebulizer machine.  14. Prednisone 50 mg daily, taper by 10 mg every three days.   15. Oxygen: 3 liters nasal cannula.   DIET: Low sodium, low fat diet.   ACTIVITY: As tolerated with home physical therapy.   FOLLOWUP: The patient is to follow up with Dr. Beverely RisenFozia Khan in one week and get Home Health and home physical therapy to come to her home.   CODE STATUS: FULL CODE.  TOTAL TIME SPENT ON ADMISSION: 45 minutes.  ____________________________ Corie ChiquitoAmir A. Lafayette DragonFirozvi, MD aaf:bjt D: 04/08/2011 11:47:56 ET T: 04/09/2011 12:40:02 ET JOB#: [id number]  cc: Karolee OhsAmir A. Lafayette DragonFirozvi, MD, <Dictator> Lyndon CodeFozia M. Khan, MD Yevonne PaxSaadat A. Khan, MD Lamar BlinksBruce J. Kowalski, MD Karolee OhsAMIR Laverda PageA Hilde Churchman MD ELECTRONICALLY SIGNED 04/09/2011 17:54

## 2014-05-16 NOTE — H&P (Signed)
PATIENT NAME:  Clayton LewandowskyASTWOOD, Clayton Norton MR#:  [id number] DATE OF BIRTH:  1929/10/19  DATE OF ADMISSION:  04/03/2011  ER REFERRING PHYSICIAN: Dr. Dana AllanMarwan Powers   PRIMARY CARE PHYSICIAN: Dr. Beverely RisenFozia Norton   CARDIOLOGIST:  Dr. Arnoldo HookerBruce Kowalski   PULMONOLOGIST: Dr. Freda MunroSaadat Norton  REASON FOR ADMISSION: Respiratory failure.   HISTORY OF PRESENT ILLNESS: The patient is an 81 year old white male with past medical history of coronary artery disease, status post stents, actively smoking, oxygen dependent on 2 liters, who presents with worsening shortness of breath, cough, green phlegm, low-grade temperature of 100.7, requiring more oxygen, who presents to the ER with shortness of breath worsening with exertion. The patient was given azithromycin, ceftriaxone, Solu-Medrol and nebulizers in the Emergency Room. He was found to have a chest x-ray that had questionable pneumonia on the right side right lung. He required 5 liters of oxygen. In addition, the patient recently was treated with herpes zoster shingles of his right eye with prednisolone. We are asked to admit the patient for respiratory failure secondary to chronic obstructive pulmonary disease and pneumonia.   PAST MEDICAL/SURGICAL HISTORY:  1. Past myocardial infarction, status post stents by Dr. Kirke CorinArida.  2. Extensive smoking history.  3. Chronic obstructive pulmonary disease, oxygen dependent.  4. Hypertension.  5. Hiatal hernia.  6. Previous transient ischemic attack.   HOME MEDICATIONS:  1. Plavix 75 mg daily.  2. Omeprazole 20 mg daily.  3. Metoprolol 25 mg b.i.Norton.  4. Lasix 40 mg daily.  5. Prednisolone one drop to right eye q.i.Norton.  6. Citalopram 10 mg daily.  7. Advair Diskus 250/50, 1 puff b.i.Norton.  8. Albuterol nebulizer p.r.n.  9. Pravastatin 20 mg daily.   ALLERGIES: No known drug allergies.   SOCIAL HISTORY: He smokes about 2 packs a day, used to be 3 to 4, has been smoking for many years. He drinks alcohol occasionally. He is a retired  Emergency planning/management officerconstruction contractor. He is married, with five children who are healthy.   FAMILY HISTORY: His mother died at age 79 of natural causes. Father died in his early 5750s of coronary artery disease and myocardial infarction.    REVIEW OF SYSTEMS: CONSTITUTIONAL: He had fever subjectively. He has fatigue and weakness. EYES: No blurred vision, double vision. He does have redness and inflammation from shingles. ENT: No tinnitus, ear pain, hearing loss, seasonal allergies, epistaxis, or discharge. RESPIRATORY: He has cough, wheezing, dyspnea, chronic obstructive pulmonary disease. CARDIOVASCULAR: No chest pain, orthopnea, edema, arrhythmia, dyspnea on exertion, palpitations. GI: No nausea, vomiting, diarrhea, abdominal pain. No hematemesis, melena, or GERD. No bright red blood per rectum. GU: No dysuria, hematuria, renal calculi, increased frequency, or incontinence. GU male. No sores, discharge or prostatitis. ENDOCRINE: No polyuria, nocturia, thyroid problems, increased sweating, heat or cold intolerance. HEME/LYMPH: No anemia, easy bruising, or swollen glands. INTEGUMENT: No acne, rash, lesions, change in mole, hair or skin. MUSCULOSKELETAL: No pain in back, shoulder, knee, hip, arthritis, swelling or gout. NEUROLOGICAL: No numbness, weakness, dysarthria, epilepsy, tremor, vertigo, ataxia. PSYCHIATRIC: No anxiety, insomnia, ADD, bipolar, or depression.   PHYSICAL EXAMINATION:  VITAL SIGNS: Temperature 99.2, heart rate 110, respiratory rate 26, sating 92% on 5 liters, blood pressure 141/72.   GENERAL: The patient is well developed, well nourished, high BMI.   HEENT: Pupils are equal, reactive to light and accommodation. Extraocular movements are intact. He does have conjunctivitis on his right eye as well as his left eye, but worse on his right eye. Sclerae are injected. No difficulty hearing.  Oropharynx is clear.   NECK: No JVD. No thyromegaly.   RESPIRATORY: Poor air flow with scant wheezing, some rales  at the right base.   CARDIOVASCULAR: Regular rate and rhythm. Normal S1 and S2. No murmurs, gallops, or rubs appreciated.   EXTREMITIES: No lower extremity edema. 2+ dorsalis pedis pulses. PMI not lateralized.   BREASTS: No obvious masses.   ABDOMEN: Soft, nontender, nondistended. Positive bowel sounds.   GU: Deferred.   MUSCULOSKELETAL: Strength five out of five. No cyanosis, degenerative joint disease, or kyphosis.   SKIN: No rashes, lesions, or induration.   LYMPH: No lymphadenopathy in the cervical or supraclavicular area.   NEUROLOGIC: Cranial nerves II through XII are intact. He has got good judgment. Strength is five out of five in all four extremities. No aphasia, dysarthria, or contractures.   PSYCHIATRIC: Alert and oriented x3 with good judgment.   LABORATORY, DIAGNOSTIC AND RADIOLOGICAL DATA:  Glucose 95, BUN 13, creatinine 1.06, sodium 140, potassium 3.2,  bicarbonate 29, anion gap 11, total protein 7.9, albumin 3.1, total bilirubin 0.6 alkaline phosphatase 41, AST 22, ALT 18. Troponin 0.03.  WBC 8.9, hemoglobin 14.4, hematocrit 44.5, platelets 152, MCV 94.  ABG: pH 7.40, pCO2 43, pO2 57, FiO2 36.  Chest x-ray on my exam showed questionable right lower lobe infiltrate. On official report, it says there is increased density at the right base, may be secondary to fibrosis, possibility of atelectasis versus pneumonia.  EKG showed sinus tachycardia with no ST changes.   ASSESSMENT AND PLAN: The patient is an 81 year old white male with past medical history of chronic obstructive pulmonary disease, smoker, coronary artery disease, status post stents, who presents with shortness of breath, cough.   1. Respiratory failure secondary most likely to pneumonia and  chronic obstructive pulmonary disease: We will continue azithromycin, ceftriaxone, nebulizers, steroids in the form of Solu-Medrol, inhalers. We will consult Dr. Freda Munro. The patient is very mobile. I do not believe he  has PE. I believe his tachycardia could be from the albuterol nebulizer he received earlier. We will monitor.  2. Coronary artery disease, hypertension: We will continue metoprolol, Plavix and statin.  3. Smoking: I counseled about cessation for over three minutes and placed him on a high-dose nicotine patch.  4. Shingles zoster with ophthalmic involvement: Continue prednisolone q.i.Norton. as recommended by Dr. Dellie Burns. Follow up as an outpatient.  5. History of congestive heart failure. We will continue Lasix.  6. Hypokalemia: We will replete and check magnesium.  7. Obesity: I counseled about diet and exercise.  8. Low albumin: I will screen for malnutrition.  9. Deep venous thrombosis prophylaxis will be maintained with Plavix and Lovenox.   CODE STATUS:  FULL CODE.     Thank you for allowing me to participate in the care of this patient.   Total Time: 55 minutes  ____________________________ Corie Chiquito. Lafayette Dragon, MD aaf:cbb Norton: 04/03/2011 16:54:09 ET T: 04/03/2011 17:58:51 ET JOB#: [id number] cc: Lyndon Code, MD Yevonne Pax, MD Lamar Blinks, MD Karolee Ohs Laverda Page MD ELECTRONICALLY SIGNED 04/03/2011 20:48
